# Patient Record
Sex: Female | Born: 1965 | Race: Black or African American | Hispanic: No | Marital: Married | Smoking: Current every day smoker
Health system: Southern US, Community
[De-identification: ages and names within clinical notes are randomized; demographics above are authoritative.]

## PROBLEM LIST (undated history)

## (undated) DIAGNOSIS — I10 Essential (primary) hypertension: Secondary | ICD-10-CM

## (undated) DIAGNOSIS — C73 Malignant neoplasm of thyroid gland: Secondary | ICD-10-CM

## (undated) HISTORY — PX: THYROIDECTOMY, PARTIAL: SHX18

## (undated) HISTORY — PX: TUBAL LIGATION: SHX77

---

## 2009-09-22 ENCOUNTER — Emergency Department: Payer: Self-pay | Admitting: Emergency Medicine

## 2012-10-22 ENCOUNTER — Emergency Department: Payer: Self-pay | Admitting: Emergency Medicine

## 2013-07-14 ENCOUNTER — Emergency Department: Payer: Self-pay | Admitting: Emergency Medicine

## 2014-06-03 ENCOUNTER — Observation Stay: Payer: Self-pay | Admitting: Internal Medicine

## 2014-06-03 LAB — URINALYSIS, COMPLETE
Bacteria: NONE SEEN
Bilirubin,UR: NEGATIVE
Blood: NEGATIVE
Glucose,UR: NEGATIVE mg/dL (ref 0–75)
Ketone: NEGATIVE
LEUKOCYTE ESTERASE: NEGATIVE
NITRITE: NEGATIVE
Ph: 5 (ref 4.5–8.0)
Protein: NEGATIVE
RBC,UR: <1 /HPF (ref 0–5)
SPECIFIC GRAVITY: 1.021 (ref 1.003–1.030)
Squamous Epithelial: 2
WBC UR: 1 /HPF (ref 0–5)

## 2014-06-03 LAB — CBC
HCT: 42.8 % (ref 35.0–47.0)
HGB: 14.6 g/dL (ref 12.0–16.0)
MCH: 32.5 pg (ref 26.0–34.0)
MCHC: 34 g/dL (ref 32.0–36.0)
MCV: 95 fL (ref 80–100)
PLATELETS: 236 10*3/uL (ref 150–440)
RBC: 4.48 10*6/uL (ref 3.80–5.20)
RDW: 13.9 % (ref 11.5–14.5)
WBC: 5.1 10*3/uL (ref 3.6–11.0)

## 2014-06-03 LAB — CK-MB
CK-MB: 48.1 ng/mL — AB (ref 0.5–3.6)
CK-MB: 44 ng/mL — ABNORMAL HIGH (ref 0.5–3.6)

## 2014-06-03 LAB — COMPREHENSIVE METABOLIC PANEL
ALBUMIN: 3.1 g/dL — AB (ref 3.4–5.0)
ALT: 65 U/L (ref 12–78)
ANION GAP: 6 — AB (ref 7–16)
Alkaline Phosphatase: 67 U/L
BUN: 12 mg/dL (ref 7–18)
Bilirubin,Total: 0.3 mg/dL (ref 0.2–1.0)
CO2: 27 mmol/L (ref 21–32)
CREATININE: 0.98 mg/dL (ref 0.60–1.30)
Calcium, Total: 8.3 mg/dL — ABNORMAL LOW (ref 8.5–10.1)
Chloride: 104 mmol/L (ref 98–107)
Glucose: 98 mg/dL (ref 65–99)
OSMOLALITY: 274 (ref 275–301)
Potassium: 3.9 mmol/L (ref 3.5–5.1)
SGOT(AST): 70 U/L — ABNORMAL HIGH (ref 15–37)
Sodium: 137 mmol/L (ref 136–145)
TOTAL PROTEIN: 8.5 g/dL — AB (ref 6.4–8.2)

## 2014-06-03 LAB — TROPONIN I: Troponin-I: <0.02 ng/mL

## 2014-06-03 LAB — TSH: Thyroid Stimulating Horm: 11.3 u[IU]/mL — ABNORMAL HIGH

## 2014-06-03 LAB — PHOSPHORUS: Phosphorus: 3.3 mg/dL (ref 2.5–4.9)

## 2014-06-03 LAB — CK TOTAL AND CKMB (NOT AT ARMC)
CK, Total: 2401 U/L — ABNORMAL HIGH
CK-MB: 52.3 ng/mL — ABNORMAL HIGH (ref 0.5–3.6)

## 2014-06-03 LAB — SEDIMENTATION RATE: Erythrocyte Sed Rate: 30 mm/hr — ABNORMAL HIGH (ref 0–20)

## 2014-06-03 LAB — LACTATE DEHYDROGENASE: LDH: 385 U/L — AB (ref 81–246)

## 2014-06-03 LAB — CK: CK, Total: 2048 U/L — ABNORMAL HIGH

## 2014-06-03 LAB — T4, FREE: Free Thyroxine: 1.02 ng/dL (ref 0.76–1.46)

## 2014-06-03 LAB — MAGNESIUM: MAGNESIUM: 1.8 mg/dL

## 2014-06-04 LAB — BASIC METABOLIC PANEL
Anion Gap: 6 — ABNORMAL LOW (ref 7–16)
BUN: 11 mg/dL (ref 7–18)
Calcium, Total: 8 mg/dL — ABNORMAL LOW (ref 8.5–10.1)
Chloride: 107 mmol/L (ref 98–107)
Co2: 28 mmol/L (ref 21–32)
Creatinine: 1.04 mg/dL (ref 0.60–1.30)
EGFR (African American): >60
EGFR (Non-African Amer.): >60
GLUCOSE: 105 mg/dL — AB (ref 65–99)
Osmolality: 281 (ref 275–301)
POTASSIUM: 4.2 mmol/L (ref 3.5–5.1)
Sodium: 141 mmol/L (ref 136–145)

## 2014-06-04 LAB — CK: CK, Total: 1785 U/L — ABNORMAL HIGH

## 2014-06-10 ENCOUNTER — Ambulatory Visit: Payer: Self-pay | Admitting: Surgery

## 2014-06-10 LAB — DRUG SCREEN, URINE
Amphetamines, Ur Screen: NEGATIVE (ref ?–1000)
BENZODIAZEPINE, UR SCRN: NEGATIVE (ref ?–200)
Barbiturates, Ur Screen: NEGATIVE (ref ?–200)
Cannabinoid 50 Ng, Ur ~~LOC~~: NEGATIVE (ref ?–50)
Cocaine Metabolite,Ur ~~LOC~~: NEGATIVE (ref ?–300)
MDMA (ECSTASY) UR SCREEN: NEGATIVE (ref ?–500)
Methadone, Ur Screen: NEGATIVE (ref ?–300)
OPIATE, UR SCREEN: NEGATIVE (ref ?–300)
PHENCYCLIDINE (PCP) UR S: NEGATIVE (ref ?–25)
Tricyclic, Ur Screen: NEGATIVE (ref ?–1000)

## 2014-06-22 LAB — PATHOLOGY REPORT

## 2014-07-15 ENCOUNTER — Ambulatory Visit: Payer: Self-pay | Admitting: Rheumatology

## 2014-07-22 ENCOUNTER — Ambulatory Visit: Payer: Self-pay | Admitting: Internal Medicine

## 2014-07-27 ENCOUNTER — Ambulatory Visit: Payer: Self-pay | Admitting: Internal Medicine

## 2014-07-29 ENCOUNTER — Ambulatory Visit: Payer: Self-pay | Admitting: Internal Medicine

## 2014-08-26 ENCOUNTER — Ambulatory Visit: Payer: Self-pay | Admitting: Internal Medicine

## 2014-11-04 ENCOUNTER — Ambulatory Visit: Payer: Self-pay | Admitting: Internal Medicine

## 2014-11-05 ENCOUNTER — Ambulatory Visit: Payer: Self-pay | Admitting: Internal Medicine

## 2014-11-26 ENCOUNTER — Ambulatory Visit: Payer: Self-pay | Admitting: Internal Medicine

## 2014-11-26 DIAGNOSIS — C73 Malignant neoplasm of thyroid gland: Secondary | ICD-10-CM

## 2014-11-26 HISTORY — DX: Malignant neoplasm of thyroid gland: C73

## 2015-03-19 NOTE — Consult Note (Signed)
PATIENT NAME:  Miranda Barrett, Miranda Barrett MR#:  [id number] DATE OF BIRTH:  1966/03/10  DATE OF CONSULTATION:  06/03/2014  REFERRING PHYSICIAN:   CONSULTING PHYSICIAN:  Meda Klinefelter., MD  REASON FOR CONSULTATION: Elevated CPK.   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 She has history of obesity. She has history of hypothyroid, not currently on medicines. For the last few months, she has gained weight, probably 45-50 pounds. In the last 3 months, she has had more difficulty with aching in her thighs. She has had more difficulty getting around the house. She has to take baby steps in order to climb the steps in her home. They had moved. She thought it was simply related to that. She has not held her grandchild. She sometimes has to get help in getting up off the floor. She has to use her hands to get up from a chair.  She started aching recently in her forearms and then started having some numbness or tingling in her forearms. Two weeks ago, she had a dizzy episode with vertigo that lasted about 2 days. She had some numbness in the left arm and was concerned about heart attack and came to the hospital by rescue. CPK was found to be 2400, TSH 11, sedimentation rate 30, CBC normal, met-C normal. She has history of cigarettes and cannabis. She has not had any Raynaud's or photosensitivity. There has been no skin rash. She is not on statin drugs, is not on any drugs. She has not had any falls.   PAST MEDICAL HISTORY: 1. Hypothyroid.  2. Obesity.   MEDICATIONS ON ADMISSION:  None.    REVIEW OF SYSTEMS: Loud snoring, sometimes wakes herself up. No shortness of breath or dysphagia. No photosensitivity. Otherwise as above.   FAMILY HISTORY: Negative for connective tissue disease.   PHYSICAL EXAMINATION:  GENERAL: Pleasant female in no acute distress.  VITAL SIGNS: Temperature 98, blood pressure 140/90, pulse 84.   SKIN: Without any rash in the hands or the face or the  scalp.  OROPHARYNX: Clear.  NECK: No thyromegaly.  NECK: Good carotid upstroke.  CHEST: Clear.  HEART: No murmur.  ABDOMEN: Nontender.  EXTREMITIES: No significant edema.  MUSCULOSKELETAL: Shoulders move well. Hips move well. Hands without synovitis.  NEUROLOGIC: Cranial nerves intact. No definite exophthalmos.  No lid lag. Diffusely hyporeflexic. She has 4/5 grip biceps, triceps, and hip flexors and neck flexors. She can get up from a chair without using her hands 5 times but has to rock forward.  She can stand on her heels and her toes. She has difficulty raising the leg up off the bed but can to mild resistance.   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 Three months of progressive weakness and elevated CPK. She has no stigmata of dermatomyositis and no obvious stigmata malignancy.  2. Hypothyroid.  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 She has had thyroid disease in the past.  3. Morbid obesity.  4. Presumed sleep apnea.  5. ASPIRIN AND NONSTEROIDAL ALLERGY: HIVES AND ANGIOEDEMA.   RECOMMENDATION:  1. Look for inflammation by doing an MRI of the thighs. If inflammation is noted then would recommend muscle biopsy. Could be arranged as an outpatient.  It has to be coordinated with pathology. If her MR is negative for inflammation then would to treat her hypothyroid and arrange EMGs to look for metabolic or inflammatory myopathy or neuropathy.  2. Work-ups for sleep apnea.  3. No nonsteroidals  given she has angioedema.  4. No steroids as it might alter a possible biopsy in the future.  5. I would be glad to see patient as an outpatient to follow up after MR.   6. We will get a drug screen tonight.    ____________________________ Meda Klinefelter., MD gwk:dd/am D: 06/03/2014 19:10:03 ET T: 06/03/2014 20:52:28 ET JOB#: [id number]  cc: Meda Klinefelter., MD, <Dictator> Ovidio Hanger MD ELECTRONICALLY SIGNED 06/04/2014  13:44

## 2015-03-19 NOTE — Op Note (Signed)
PATIENT NAME:  Miranda Barrett, Miranda Barrett MR#:  [id number] DATE OF BIRTH:  01/11/1966  DATE OF PROCEDURE:  06/10/2014  PREOPERATIVE DIAGNOSIS: Muscle pain and weakness, concern for polymyositis.   POSTOPERATIVE DIAGNOSIS: Muscle pain and weakness, concern for polymyositis.   PROCEDURE PERFORMED: Left quadriceps thigh muscle biopsy.   ANESTHESIA: General.   ESTIMATED BLOOD LOSS: 10 mL.  SPECIMEN:  1.  Left quadriceps thigh muscle biopsy.  2.  Muscle biopsy to be fixed in formalin for EM studies, potentially.   COMPLICATIONS: None.   INDICATION FOR SURGERY: [name] is a pleasant 49 year old female with recent onset of diffuse upper and lower limb pain and worsening weakness. She had an elevated CK and a MRI which showed muscle inflammation. I was thus consulted for a left quadriceps muscle biopsy.   DETAILS OF PROCEDURE: Informed consent was obtained. [name] was brought to the operating room suite. She was laid supine on the operating room table. She was induced. LMA was placed. General anesthesia was administered. Her left leg was then prepped and draped in standard surgical fashion. A timeout was then performed correctly identifying the patient's name, operative site and procedure to be performed. A length-wise incision was made on her anterior lateral thigh over her quadriceps. This was deepened down to the muscle. The fascia was incised. The muscle was evaluated. It was very light pink and when cut it bled only minimally. I then grabbed a significant piece of muscle that was excised using a scalpel and this was sent to pathology. I then took another large piece and placed this into a clamp to prevent retraction. This was sent to pathology again as well. The wound was examined and made hemostatic. The wound was then closed in layers. The muscle fascia was closed with a running 3-0 Vicryl and the skin was closed with a running 4-0 Monocryl subcuticular. Steri-Strips, Telfa gauze and Tegaderm then  completed the dressing. The patient was then awoken, extubated and brought to the postanesthesia care unit. There were no immediate complications. Needle, sponge, and instrument count was correct at the end of the procedure.   ____________________________ Glena Norfolk. Cyriah Childrey, MD cal:sb D: 06/11/2014 08:54:26 ET T: 06/11/2014 09:12:29 ET JOB#: [id number]  cc: Harrell Gave A. Brycelynn Stampley, MD, <Dictator> Floyde Parkins MD ELECTRONICALLY SIGNED 06/24/2014 14:51

## 2015-03-19 NOTE — Consult Note (Signed)
Brief Consult Note: Patient was seen by consultant.   Consult note dictated.   Comments: 3 mo hx of muscle weakness(trouble climbing stairs,getting up from chair) thigh pain, now with elevated cpk, mild elevation of TSH (prior rx for hypo thyroid). cannot r/o inflammatory myopathy rec 1 .Marland KitchenMRI both thighs, r/o inflammation.if positive,then arrange muscle bx as outpatient .if neg, then rx thyroid, arrange emgs as outpatient  2. no steriods, may alter bx results  3 urine drug screen ordered  4 w/up for sleep apnea 5 no NSAIDS as she gets asa/nsaid angioedema.  Electronic Signatures: Leeanne Mannan., Eugene Gavia (MD)  (Signed 09-Jul-15 19:00)  Authored: Brief Consult Note   Last Updated: 09-Jul-15 19:00 by Leeanne Mannan., Eugene Gavia (MD)

## 2015-03-19 NOTE — H&P (Signed)
PATIENT NAME:  Miranda Barrett, Miranda Barrett MR#:  [id number] DATE OF BIRTH:  17-Nov-1966  DATE OF ADMISSION:  06/03/2014  PRIMARY CARE PHYSICIAN: None.   CHIEF COMPLAINT: Paresthesias of the arms.   HISTORY OF PRESENT ILLNESS: Miranda Barrett is a 49 year old, very obese, African American female with no significant past medical history, presents to the hospital complaining of tingling, numbness, and hurting of her upper arms and hips and thigh region.  Patient states her symptoms actually started about 5 months ago when she started noticing pain in her hips and thighs in the morning that persisted throughout the day. She thought, maybe, they need to change their mattresses or they are sleeping on too hard surfaces. The pain gradually progressed now to her arms. She feels like she has been fighting with the arms for long so they ache, mostly in the proximal muscles and the posterior arm muscles, and she will notice over the last few weeks that she is having some tingling in the forearm region as well. She does not take any medications at home.  She says that she stays dehydrated.  They have moved to a new home a couple of weeks ago, lifted a lot of things and had to climb a lot of stairs with the moving process, and since then, her symptoms have actually gotten worse to the point that she was not able to do anything with her arms. This morning, her left arm tingling was much worse, so she thought she was having a heart attack, especially she being a smoker got worried and presented to the Emergency Room. All her labs here look normal, except for CK is elevated to 2400, which is almost 10 times the normal value, but her troponin is negative. She is being admitted for possible myositis at this time. Her urine does not show any myoglobinuria and her renal function is normal.   PAST MEDICAL HISTORY: Tobacco use disorder.   PAST SURGICAL HISTORY:  1.  Three C-sections.  2.  Third nipple resection from her breast.  ALLERGIES TO  MEDICATIONS: ASPIRIN CAUSES AN ANAPHYLACTIC REACTION.  CURRENT HOME MEDICATIONS: Multivitamin 1 tablet p.o. daily and Tylenol as needed for aches, that she has been using recently.   SOCIAL HISTORY: Lives at home.  Smokes about 1 pack per day.  Does not drink alcohol, uses marijuana. Last use was this past weekend.   FAMILY HISTORY: Mom has arthritis, diabetes, hypertension, and kidney disease, and dad passed away from pancreatic cancer.    REVIEW OF SYSTEMS:  CONSTITUTIONAL: No fever. Positive for fatigue and weakness.  EYES: Positive for blurred vision, and uses reading glasses. No inflammation, glaucoma or cataracts.  EAR, NOSE, AND THROAT: No tinnitus, ear pain, hearing loss, epistaxis or discharge.  RESPIRATORY: No cough, wheeze, hemoptysis or COPD.  CARDIOVASCULAR: No chest pain, orthopnea, edema, arrhythmia, palpitations, or syncope.  GASTROINTESTINAL: No nausea, vomiting, diarrhea, abdominal pain, hematemesis, or melena.  GENITOURINARY: No dysuria, hematuria, renal calculus, frequency, or incontinence.  ENDOCRINE: No polyuria, nocturia, thyroid problems, heat or cold intolerance.  HEMATOLOGY: No anemia, easy bruising or bleeding.  SKIN: No acne, rash or lesion.  MUSCULOSKELETAL: Bilateral arm pain in the upper and mid-back region between the shoulder blades.  Possible arthritis.  No gout.  NEUROLOGICAL: No numbness, weakness, CVA, TIA or seizures.  PSYCHOLOGIC: No anxiety, insomnia or depression.  PHYSICAL EXAMINATION: VITAL SIGNS:  Temperature 98.8 degrees Fahrenheit, pulse 96, respirations 18, blood pressure 131/92, pulse oximetry 92% on room air.  GENERAL: Heavily built, well-nourished  female lying in bed, not in any acute distress.  HEENT: Normocephalic, atraumatic. Pupils equal, round, reacting to light. Anicteric sclerae. Extraocular movements intact.  OROPHARYNX: Clear without erythema, mass or exudates.  NECK: Supple. No thyromegaly, JVD or carotid bruits. No  lymphadenopathy.  LUNGS: Moving air bilaterally. No wheeze or crackles. No use of accessory muscles for breathing.  CARDIOVASCULAR: S1, S2, regular rate and rhythm. No murmurs, rubs, or gallops.  ABDOMEN: Obese, soft, nontender, nondistended. No hepatosplenomegaly. Normal bowel sounds.  EXTREMITIES: No pedal edema. No clubbing or cyanosis.  Two plus dorsalis pedis pulses palpable bilaterally.  The upper arms are tender to touch.  SKIN: No acne, rash or lesions.  LYMPHATICS: No cervical lymphadenopathy.  NEUROLOGICAL: Cranial nerves are intact. No focal motor or sensory deficits.  PSYCHOLOGICAL: The patient is awake, alert, oriented x 3.   LABORATORY DATA: WBC 5.1, hemoglobin 14.6, hematocrit 42.8, platelet count 336,000.   Sodium 137, potassium 3.9, chloride 104, bicarbonate 27, BUN 12, creatinine 0.98, glucose 98, and calcium of 8.3.   ALT 65, AST 70, alkaline phosphatase 67, total bilirubin 0.3 and albumin of 3.1. CK is elevated at 2401, CK-MB elevated at 52.3 and troponin is less than 0.02.   Chest x-ray is showing clear lung fields. No acute cardiopulmonary disease.   Urinalysis negative for any infection, blood, or protein.  EKG is showing normal sinus rhythm, heart rate of 81 no acute ST-T wave abnormalities.   ASSESSMENT AND PLAN: A 49 year old obese female with no significant past medical history, admitted for paresthesias and muscle aches, and noted to have CK of 2400, muscle aches with elevated CK and normal renal function, and urinalysis negative for any myoglobinuria.   1. Could be myositis, not on any statin that could cause it.  Not sure if inflammatory or idiopathic in nature at this time.  Ordered LDH, aldolase, ANA, ESR, and CRP.  We might benefit from giving steroids for now.  Continue IV fluids.  Recheck CPK level, also recycle troponins as CK-MB also elevated.  Rheumatology consulted  2. Tobacco use disorder. Counseled against smoking for 3 minutes, motivated to quit.   For now, we will start on a Nicotrol inhaler.   CODE STATUS: FULL CODE.   TIME SPENT ON ADMISSION: 50 minutes.  ____________________________ Gladstone Lighter, MD rk:ts D: 06/03/2014 21:19:41 ET T: 06/03/2014 13:43:10 ET JOB#: [id number]  cc: Gladstone Lighter, MD, <Dictator> Gladstone Lighter MD ELECTRONICALLY SIGNED 06/04/2014 12:04

## 2015-03-19 NOTE — Consult Note (Signed)
PATIENT NAME:  Miranda Barrett, Miranda Barrett MR#:  [id number] DATE OF BIRTH:  30-Apr-1966  DATE OF CONSULTATION:  06/05/2014  CONSULTING PHYSICIAN:  Harrell Gave A. Yahye Siebert, MD  REASON FOR CONSULTATION: Tingling, numbness and pain of arms and thighs.  HISTORY OF PRESENT ILLNESS: Miranda Barrett is a 49 year old obese female, with history of hypothyroidism and previous C-sections who presents with approximately 5 months of tingling, numbness and hurting of upper arms, hips thighs.  It began in her thighs and progressed to her arm. She has difficulties with weakness as well and reports she is unable to do very little with her arms. She was admitted to the Emergency Room for worsening left arm tingling and thought she was having an MI. She was found to have a CK of 2400 and was admitted for possible myositis. She was seen by Dr. Jefm Bryant who recommended MRI of the thigh which showed inflammation concerning for myositis.  I am thus consulted for a muscle biopsy. Otherwise, no fevers, chills, night sweats, shortness of breath, cough, chest pain, abdominal pain, nausea, vomiting, diarrhea, constipation, dysuria or hematuria.   PAST MEDICAL HISTORY: 1.  Hypothyroidism.  2.  History of 3 C-sections.  3.  History of 3rd nipple resection from breast.  4.  Morbid obesity.   HOME MEDICATIONS:   1.  Multivitamin.  2.  Tylenol p.r.n.   ALLERGIES: ASPIRIN CAUSES ANAPHYLACTIC TYPE REACTION.   SOCIAL HISTORY: Smokes 1 pack a day, uses marijuana.  No alcohol. Lives at home in [place].  Phone number [phone].   FAMILY HISTORY: Mother with arthritis, diabetes, hypertension, kidney disease.  Dad passed away from pancreatic cancer.  REVIEW OF SYSTEMS:  A 12-point review of systems was obtained and pertinent positives and negatives as above.   PHYSICAL EXAMINATION: VITAL SIGNS: Temperature 97.8, pulse 82, blood pressure 129/84, respirations 20, 97% on room air.  GENERAL: No acute distress. Alert and oriented x3.  HEAD:  Normocephalic, atraumatic.  EYES: No scleral icterus. No conjunctivitis.  FACE:  No obvious face trauma.  No external nose.  Normal external ears.   CHEST: Lungs clear to auscultation. Moving air well.  HEART: Regular rate and rhythm. No murmurs, rubs, or gallops.  ABDOMEN: Soft, nontender, nondistended.  EXTREMITIES: Moves all extremities is sitting up in bed.  NEUROLOGIC: Sensation appears to be intact. Cranial nerves II through XII grossly intact.   LABORATORY DATA: CK of 2400 currently 1785. Troponin less than 0.02, TSH 11.3. White cell count is 5.1. ESR is 30. Urinalysis is negative. LDH is 385. BMP is otherwise normal. CMP is otherwise normal.   IMAGING: MRI of bilateral thighs shows patchy areas of abnormal edema in the muscles of both thighs, asymmetric, consistent with myositis.   ASSESSMENT AND PLAN: Miranda Barrett is a pleasant 49 year old female with worsening arm and leg pain and weakness. She had findings consistent for polymyositis on her MRI of femur. I have thus been consulted for a muscle biopsy. Due to the fact that the leg muscles were involved, I would likely perform this.  I talked to Dr. Reuel Derby.  She says these specimens must be sent fresh and therefore cannot be done over the weekend. I have discussed with her the possibility of staying over the weekend for biopsy with Dr. Marina Gravel next week versus going home and having it done as an outpatient and she would prefer outpatient, but we will see how she feels tomorrow.  If outpatient surgery is needed to be scheduled, will  contact the number [phone].  ____________________________ Glena Norfolk Fate Galanti, MD cal:ds D: 06/05/2014 12:47:37 ET T: 06/05/2014 15:10:32 ET JOB#: [id number]  cc: Harrell Gave A. Billy Turvey, MD, <Dictator> Floyde Parkins MD ELECTRONICALLY SIGNED 06/06/2014 18:16

## 2015-03-19 NOTE — Discharge Summary (Signed)
PATIENT NAME:  Miranda Barrett, HOLLAR MR#:  [id number] DATE OF BIRTH:  05-07-1966  DATE OF ADMISSION:  06/03/2014 DATE OF DISCHARGE:  06/05/2014  ADMITTING DIAGNOSIS: Myositis.   DISCHARGE DIAGNOSES:  1. Polymyositis of unclear etiology. Biopsy to be scheduled as outpatient.  2. Tobacco abuse.  3. Euthyroid syndrome.   DISCHARGE CONDITION: Stable.   DISCHARGE MEDICATIONS: The patient is to resume multivitamins once daily, Tylenol 325 mg 2 tablets every 4 hours as needed, tramadol 50 mg every 6 hours as needed, nicotine oral inhaler 10 mg inhalation every 1 to 2 hours as needed.   HOME HEALTH: None.  HOME OXYGEN: None.   DIET: Regular, regular consistency.   ACTIVITY LIMITATIONS: As tolerated.   FOLLOWUP APPOINTMENT: With surgery, Dr. Rexene Edison in 2 days after discharge, rheumatology, Dr. Jefm Bryant in 1 week after discharge.   CONSULTATIONS: Dr. Rexene Edison as well as Dr. Jefm Bryant.  RADIOLOGIC STUDIES: Chest x-ray PA and lateral, 06/03/2014 showed a negative chest x-ray. MRI of bilateral lower extremities without contrast, 06/04/2014 revealed polymyositis of indeterminate etiology. See above discussion. Could be due to autoimmune granulomatous or vasculitic or systemic illnesses. Muscle infection, drug-induced rhabdomyolysis, diabetes, mild necrosis, and autoimmune polyneuropathy can appear this way according to the radiologist.   HISTORY OF PRESENT ILLNESS: The patient is a 49 year old African American female with past medical history significant for history of obesity, tobacco abuse, who presents to the hospital with complaints of paresthesias in the arms. Please refer to Dr. Wyatt Portela admission note on 06/03/2014.   PHYSICAL EXAMINATION:  VITALS SIGNS: On admission to the hospital, the patient's temperature was 98.8, pulse was 96, respiration rate was 18, blood pressure 131/92, pulse oximetry was 92% on room air.  GENERAL: Physical exam was unremarkable except of mild discomfort on palpation  of her muscles.  LABORATORY DATA: The patient's lab examination done on arrival to the hospital revealed normal BMP. Patient's CK total was elevated at 2400. MB fraction was 52.3. Troponin was less than 0.02. TSH was elevated at 11.3; however, the patient's free thyroxine level was 1.02 which was within normal limits. The patient's erythrocyte sedimentation rate was found to be elevated at 30. White blood cell count was normal at 5.1, hemoglobin was 14.6, platelet count 236,000. Urinalysis was unremarkable.   The patient was admitted to the hospital for further evaluation. She was evaluated and consulted by rheumatologist, Dr. Jefm Bryant, who saw patient in consultation the same day, 06/03/2014. Dr. Jefm Bryant felt the patient has inflammatory myopathy due to 3 months of progressive weakness as well as elevated CPK. According to Dr. Jefm Bryant, the patient had no stigmata of dermatomyositis and no obvious stigmata of malignancy In regards to hypothyroidism, according to Dr. Jefm Bryant, it can represent thyroid myopathy; however, the patient's TSH was not elevated. I recommended to get MRI of the patient's thighs. If inflammation is noted then they recommended to proceed to a muscle biopsy, which can be arranged as outpatient. If MRI is negative for inflammation, then Dr. Jefm Bryant recommended to treat her for hypothyroidism and arrange EMG to look for metabolic or inflammatory myopathy or neuropathy. He also recommended to work up for sleep apnea as well as not to use any aspirin or other nonsteroidal anti-inflammatory medications as the patient has history of angioedema with these medications as well as not to use steroids, as it may alter a possible biopsy in the future. He recommended to follow up with him as outpatient after MRI. The patient did have radiologic studies done. MRI of muscles of the  thigh, which revealed inflammation. She also had aldolase level checked which is pending at the time of the dictation. CPR  was checked, was found to be high at 3.99. The patient's ANA comprehensive panel was checked which anti-DNA antibodies were negative. The patient's RNP antibodies were less than 0.1, which were negative. Smith antibodies 0.2, which were negative. Antiscleroderma 70 antibodies were 0.2 as well. However, Sjgren's anti SSA as well as Sjgren's anti SSD were high, more than 8. The patient did have a urine drug screen checked which was negative for any substances.   The patient was initiated on pain medications and her discomfort, pain, improved somewhat. She was also given IV fluids to rehydrate her. She had urinalysis checked which showed no myoglobin consistent with myopathy but not rhabdomyolysis. The patient was evaluated by Dr. Rexene Edison, who recommended outpatient muscle biopsy for which the patient was agreeable. The patient is to follow up with Dr. Rexene Edison in the next few days after discharge to get muscle biopsy. She is to follow up with Dr. Jefm Bryant for further recommendations and treatment for myopathy.  VITALS SIGNS: On the day of discharge, the patient's vital signs: Temperature was 99.6, pulse was 76, respiration rate was 20, blood pressure 143/88, saturation 98% on room air at rest.   TIME SPENT: 49 minutes on this patient.  ,  ____________________________ Theodoro Grist, MD rv:lt D: 06/05/2014 17:58:51 ET T: 06/05/2014 22:39:31 ET JOB#: [id number]  cc: Theodoro Grist, MD, <Dictator> Christopher A. Lundquist, MD Meda Klinefelter., MD   Theodoro Grist MD ELECTRONICALLY SIGNED 06/20/2014 16:21

## 2015-04-14 ENCOUNTER — Telehealth: Payer: Self-pay | Admitting: Internal Medicine

## 2015-04-14 NOTE — Telephone Encounter (Addendum)
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 Now she has received a new dx of thyroid cancer after bx @ [place] last week. They want her to go to [facility] for surgery but she had a very good experience with [place] and wants to stick with [facility] and our [place] for any necessary treatments. Can you please call her to discuss surgeons and how to get her referral changed to [facility]? Thanks. Please call: [phone].

## 2015-04-15 NOTE — Telephone Encounter (Signed)
Spoke to pt and she thought because she got the dx from bx that it ws thyroid cancer that she needs to see onc. First.  I told her that with thyroid cancer the first step after bx is sch. Surgery and the thyroid will be removed.  After that depending on the pathology she would either f/u with oncology or endocrinologist.  In knowing that she will just stick with [facility] because she has a relationship there.

## 2015-04-26 ENCOUNTER — Ambulatory Visit: Payer: Medicaid Other | Attending: Family Medicine | Admitting: Physical Therapy

## 2015-05-11 ENCOUNTER — Ambulatory Visit: Payer: Medicaid Other | Admitting: Physical Therapy

## 2015-06-02 ENCOUNTER — Ambulatory Visit: Payer: Medicaid Other

## 2015-11-08 ENCOUNTER — Other Ambulatory Visit: Payer: Self-pay | Admitting: Rheumatology

## 2015-11-08 DIAGNOSIS — M3320 Polymyositis, organ involvement unspecified: Secondary | ICD-10-CM

## 2015-11-22 ENCOUNTER — Ambulatory Visit: Payer: Medicaid Other

## 2016-01-02 ENCOUNTER — Ambulatory Visit
Admission: RE | Admit: 2016-01-02 | Discharge: 2016-01-02 | Disposition: A | Discharge status: Home / Self Care | Payer: Medicaid Other | Source: Ambulatory Visit | Attending: Rheumatology | Admitting: Rheumatology

## 2016-01-02 DIAGNOSIS — R1902 Left upper quadrant abdominal swelling, mass and lump: Secondary | ICD-10-CM | POA: Insufficient documentation

## 2016-01-02 DIAGNOSIS — E279 Disorder of adrenal gland, unspecified: Secondary | ICD-10-CM | POA: Insufficient documentation

## 2016-01-02 DIAGNOSIS — M3320 Polymyositis, organ involvement unspecified: Secondary | ICD-10-CM

## 2016-01-02 HISTORY — DX: Malignant neoplasm of thyroid gland: C73

## 2016-01-02 HISTORY — DX: Essential (primary) hypertension: I10

## 2016-01-02 MED ORDER — IOHEXOL 300 MG/ML  SOLN
100.0000 mL | Freq: Once | INTRAMUSCULAR | Status: AC | PRN
  Administered 2016-01-02: 100 mL via INTRAVENOUS

## 2016-03-12 IMAGING — CT CT ABD-PELV W/ CM
1 of 3 series · 14 of 32 positions shown, 19 images · IV contrast (APPLIED)
Comparison: 11/04/2014

CLINICAL DATA: Followup left upper quadrant mass. Loss of appetite
for 6 months.

EXAM:
CT ABDOMEN AND PELVIS WITH CONTRAST
TECHNIQUE: Multidetector CT imaging of the abdomen and pelvis was performed
using the standard protocol following bolus administration of
intravenous contrast.

[Series 2: axial st · axial · 0.71mm/px · z∈[-900,-515]mm · 14 of 87 slices shown, 19 images]
[im 5/87  soft-tissue]
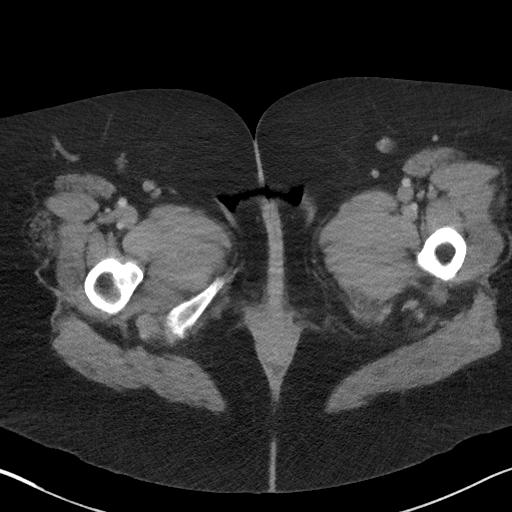
[im 5/87  bone]
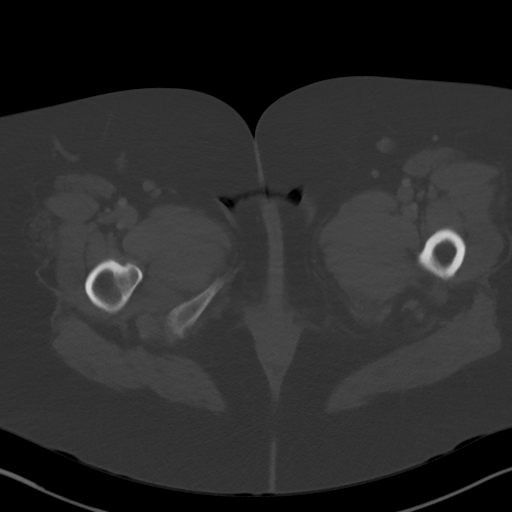
[im 14/87  soft-tissue]
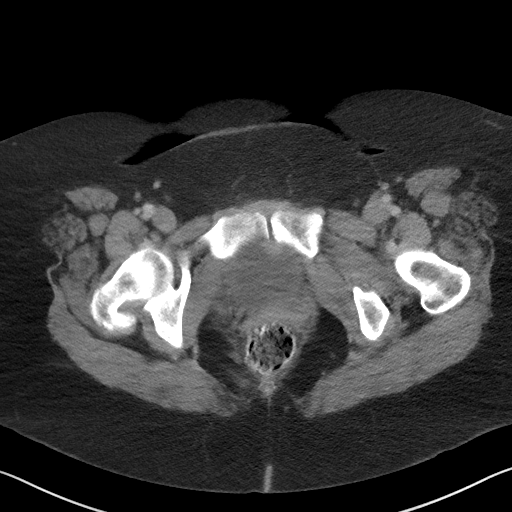
[im 19/87  soft-tissue]
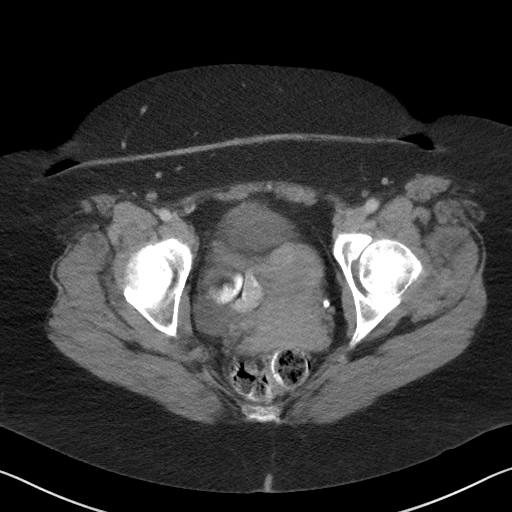
[im 23/87  soft-tissue]
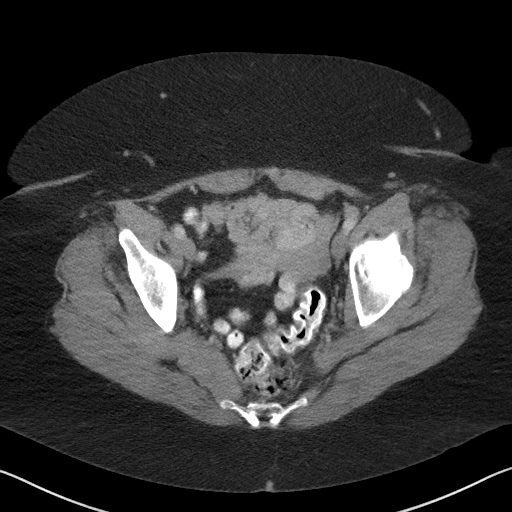
[im 32/87  soft-tissue]
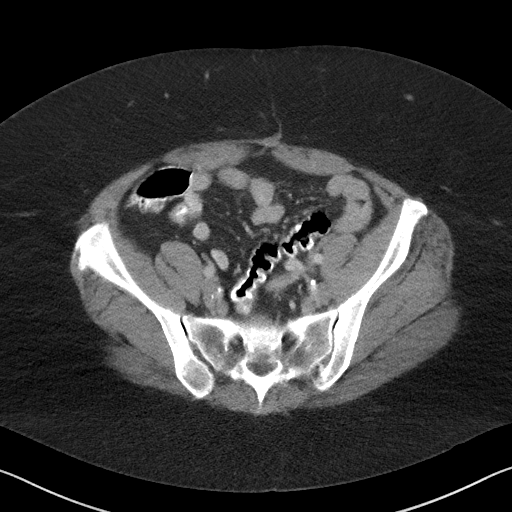
[im 37/87  soft-tissue]
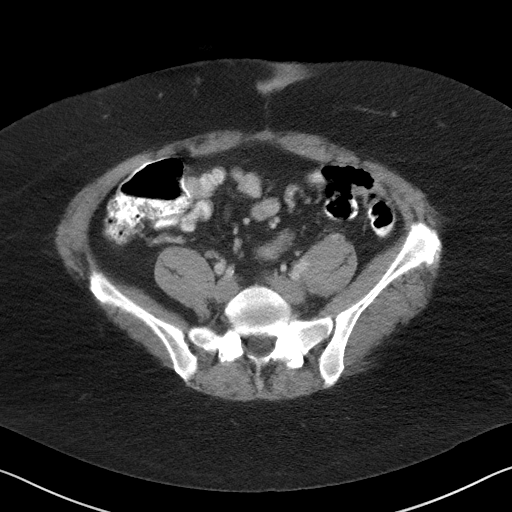
[im 46/87  soft-tissue]
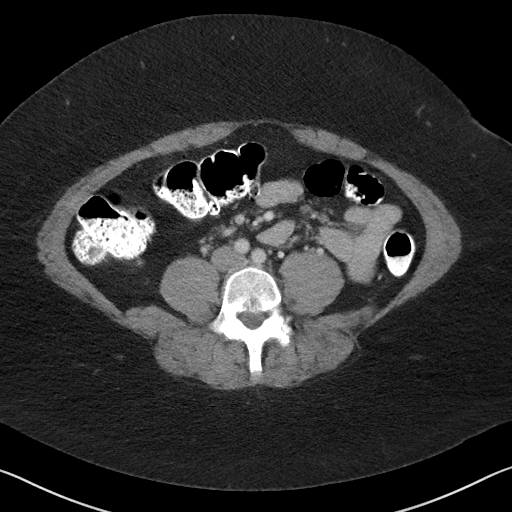
[im 50/87  soft-tissue]
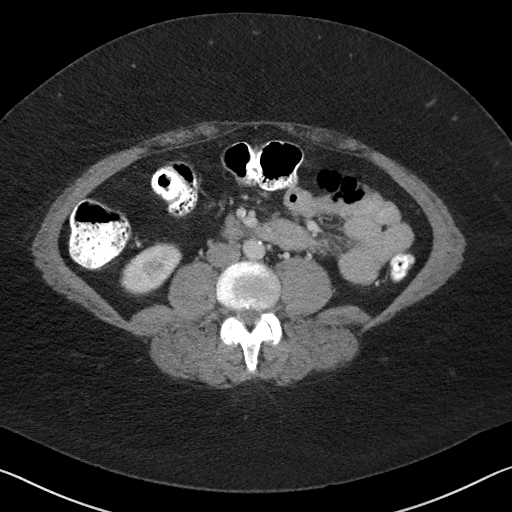
[im 55/87  soft-tissue]
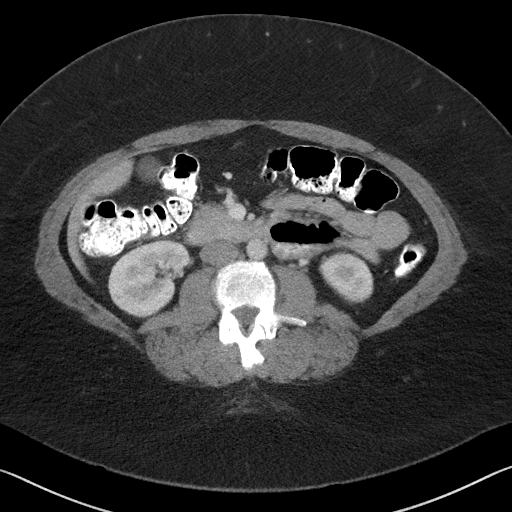
[im 55/87  bone]
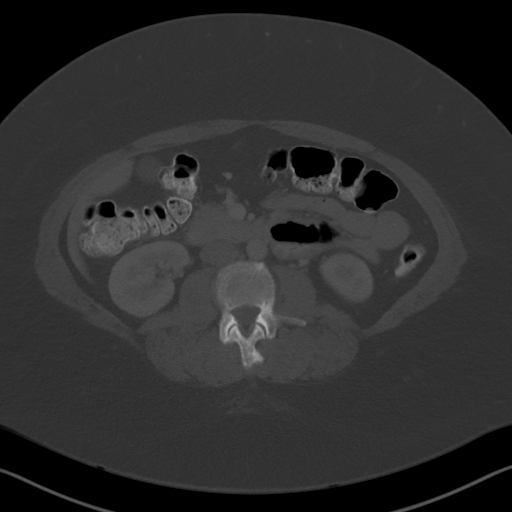
[im 64/87  soft-tissue]
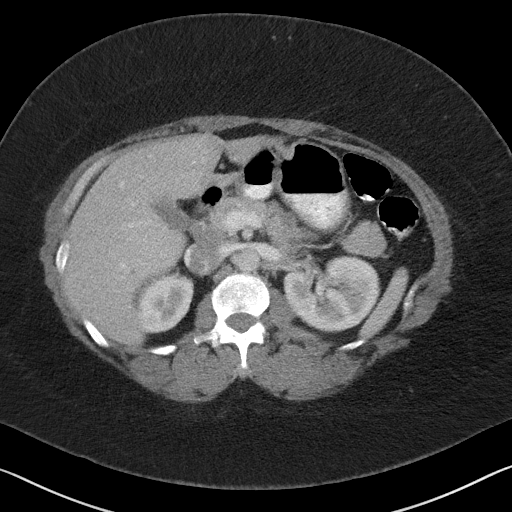
[im 68/87  soft-tissue]
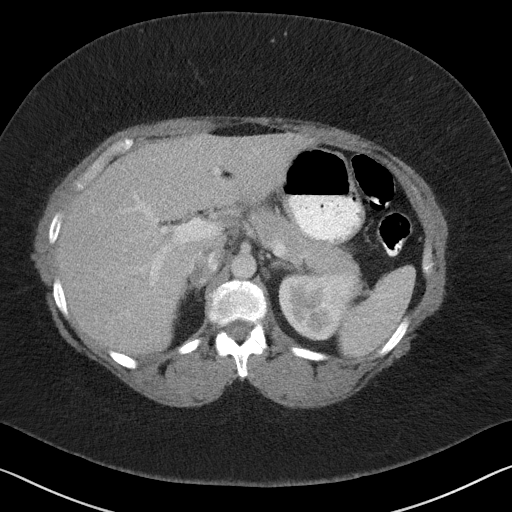
[im 68/87  lung]
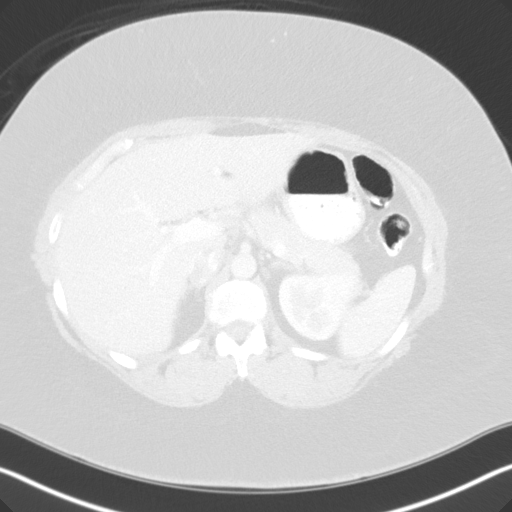
[im 73/87  soft-tissue]
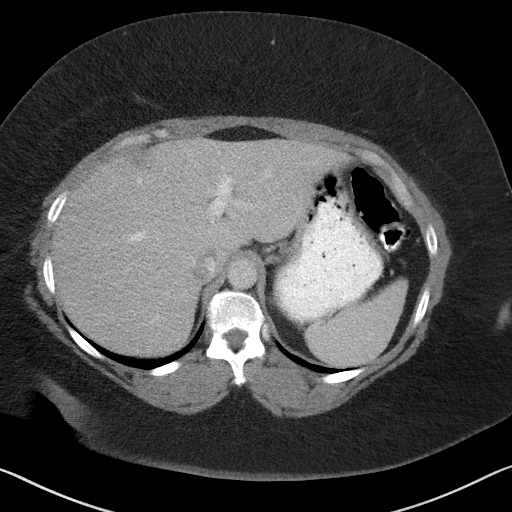
[im 73/87  lung]
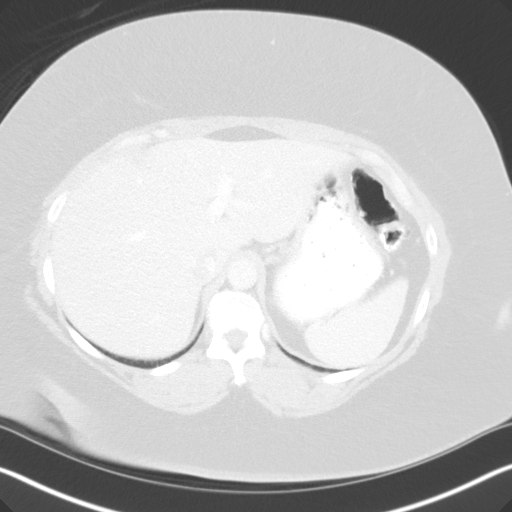
[im 77/87  lung]
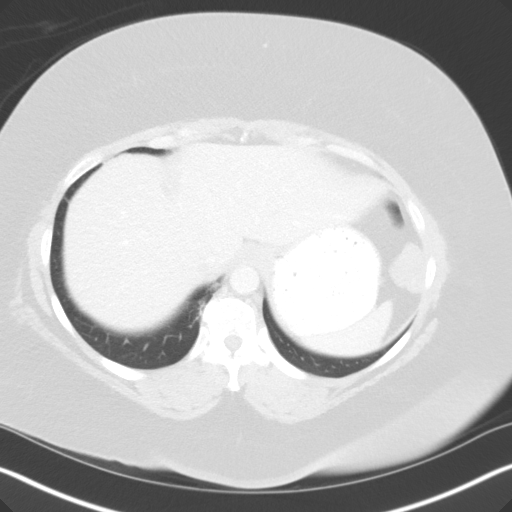
[im 82/87  soft-tissue]
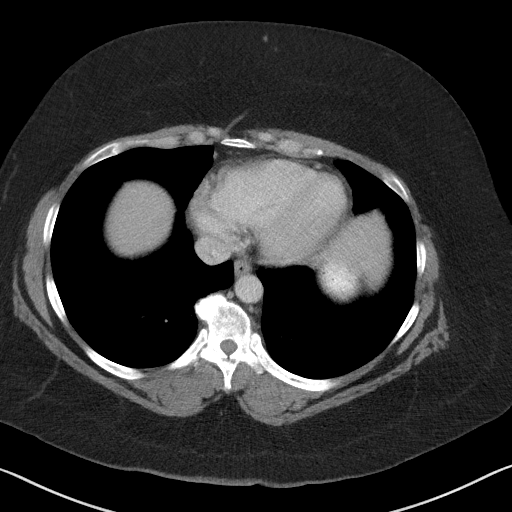
[im 82/87  lung]
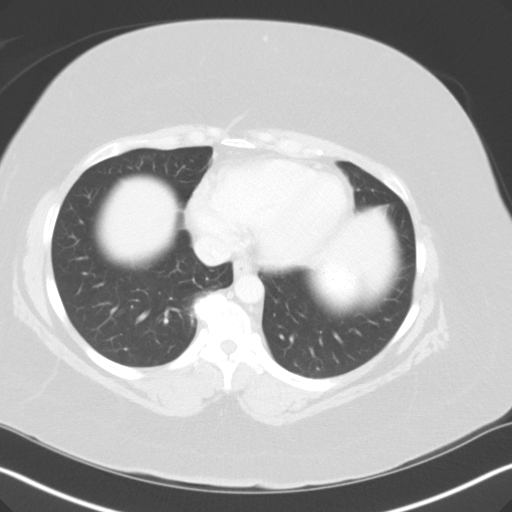

[14 of 32 positions shown; findings below may reference images not displayed]

CONTRAST:  100mL OMNIPAQUE IOHEXOL 300 MG/ML  SOLN

The patient developed severe itching immediately after the study
compatible with an allergic reaction. The patient was assessed by
Dr. Afanu and given 25 mg of Benadryl PO. The itching greatly
subsided over the next 20-30 minutes. No additional problems.
FINDINGS: Lung bases are clear.  No effusions.  Heart is normal size.

Tiny hypodensity peripherally in the right hepatic lobe is stable
since prior study and most likely a small cyst. Liver otherwise
unremarkable. Spleen, gallbladder, pancreas, right adrenal and
kidneys are unremarkable. Small low-density nodule in the left
adrenal gland measures 12 mm and is stable.

Low-density mass in the left upper quadrant adjacent to the left
hemidiaphragm is again noted, measuring up to 3.2 cm, stable.

Bowel grossly unremarkable. No free fluid, free air, or adenopathy.
Uterus, adnexae and urinary bladder are unremarkable. Aorta and
iliac vessels are normal caliber. No acute bony abnormality or focal
bone lesion.
IMPRESSION: Stable 3.2 cm low-density mass in the left upper quadrant adjacent
left hemidiaphragm. Stability since 1303 suggests a benign process.

Stable left adrenal nodule, likely adenoma.

## 2017-10-29 ENCOUNTER — Encounter: Payer: Self-pay | Admitting: Emergency Medicine

## 2017-10-29 ENCOUNTER — Other Ambulatory Visit: Payer: Self-pay

## 2017-10-29 ENCOUNTER — Emergency Department: Payer: Medicare Other

## 2017-10-29 ENCOUNTER — Emergency Department
Admission: EM | Admit: 2017-10-29 | Discharge: 2017-10-29 | Disposition: A | Discharge status: Home / Self Care | Payer: Medicare Other | Attending: Emergency Medicine | Admitting: Emergency Medicine

## 2017-10-29 DIAGNOSIS — F17210 Nicotine dependence, cigarettes, uncomplicated: Secondary | ICD-10-CM | POA: Insufficient documentation

## 2017-10-29 DIAGNOSIS — R55 Syncope and collapse: Secondary | ICD-10-CM | POA: Insufficient documentation

## 2017-10-29 DIAGNOSIS — Z79899 Other long term (current) drug therapy: Secondary | ICD-10-CM | POA: Insufficient documentation

## 2017-10-29 DIAGNOSIS — I10 Essential (primary) hypertension: Secondary | ICD-10-CM | POA: Diagnosis not present

## 2017-10-29 LAB — URINALYSIS, COMPLETE (UACMP) WITH MICROSCOPIC
Bilirubin Urine: NEGATIVE
Glucose, UA: NEGATIVE mg/dL
Hgb urine dipstick: NEGATIVE
Ketones, ur: NEGATIVE mg/dL
Nitrite: NEGATIVE
PH: 7 (ref 5.0–8.0)
Protein, ur: NEGATIVE mg/dL
SPECIFIC GRAVITY, URINE: 1.011 (ref 1.005–1.030)

## 2017-10-29 LAB — CBC
HEMATOCRIT: 45.3 % (ref 35.0–47.0)
Hemoglobin: 15.5 g/dL (ref 12.0–16.0)
MCH: 31.8 pg (ref 26.0–34.0)
MCHC: 34.2 g/dL (ref 32.0–36.0)
MCV: 93 fL (ref 80.0–100.0)
PLATELETS: 265 10*3/uL (ref 150–440)
RBC: 4.87 MIL/uL (ref 3.80–5.20)
RDW: 13.9 % (ref 11.5–14.5)
WBC: 4.6 10*3/uL (ref 3.6–11.0)

## 2017-10-29 LAB — BASIC METABOLIC PANEL
Anion gap: 8 (ref 5–15)
BUN: 18 mg/dL (ref 6–20)
CHLORIDE: 104 mmol/L (ref 101–111)
CO2: 25 mmol/L (ref 22–32)
CREATININE: 0.9 mg/dL (ref 0.44–1.00)
Calcium: 8.9 mg/dL (ref 8.9–10.3)
GFR calc Af Amer: >60 mL/min (ref >60)
GFR calc non Af Amer: >60 mL/min (ref >60)
GLUCOSE: 108 mg/dL — AB (ref 65–99)
Potassium: 3.9 mmol/L (ref 3.5–5.1)
Sodium: 137 mmol/L (ref 135–145)

## 2017-10-29 LAB — POCT PREGNANCY, URINE: PREG TEST UR: NEGATIVE

## 2017-10-29 NOTE — ED Triage Notes (Signed)
Pt to ED c/o near syncopal episodes x2 weeks, denies LOC, denies pain or SOB.

## 2017-10-29 NOTE — ED Provider Notes (Signed)
[facility] Emergency Department Provider Note ____________________________________________   I have reviewed the triage vital signs and the triage nursing note.  HISTORY  Chief Complaint Near Syncope   Historian Patient  HPI Miranda Barrett is a 51 y.o. female presents to the ED after she went to try to get her outpatient rheumatology appointment today, where she is followed for polymyositis, and states that they had trouble getting her blood pressure and told her to go to the ER for further evaluation.  Patient states that she feels "bad "all the time in terms of fatigue.  Today driving and she felt a little bit lightheaded.  No focal weakness or numbness.  Patient states a few days ago she felt a little lightheaded like she might pass out but did not fully pass out.  No chest pain or palpitations.   Past Medical History:  Diagnosis Date  . Hypertension   . Thyroid cancer ([place]) 2016   Left thyroidectomy     There are no active problems to display for this patient.   Past Surgical History:  Procedure Laterality Date  . CESAREAN SECTION    . THYROIDECTOMY, PARTIAL    . TUBAL LIGATION      Prior to Admission medications   Medication Sig Start Date End Date Taking? Authorizing Provider  albuterol (PROAIR HFA) 108 (90 Base) MCG/ACT inhaler Inhale 2 puffs into the lungs every 4 (four) hours as needed. 01/03/17  Yes [provider]  folic acid (FOLVITE) 1 MG tablet Take 1 tablet by mouth daily. 01/17/15  Yes [provider]  hydrOXYzine (ATARAX/VISTARIL) 25 MG tablet Take 1 tablet by mouth 3 (three) times daily. 08/20/14  Yes [provider]  levothyroxine (SYNTHROID, LEVOTHROID) 137 MCG tablet Take 1 tablet by mouth daily. 07/22/17  Yes [provider]  predniSONE (DELTASONE) 20 MG tablet Take 1 tablet by mouth daily. 07/25/17  Yes [provider]  traMADol (ULTRAM) 50 MG tablet Take 50 mg by mouth every 6 (six)  hours as needed. 04/11/15  Yes [provider]  triamterene-hydrochlorothiazide (DYAZIDE) 37.5-25 MG capsule Take 1 capsule by mouth daily. 10/02/16  Yes [provider]  Vitamin D, Ergocalciferol, (DRISDOL) 50000 units CAPS capsule Take 1 capsule by mouth every 7 (seven) days. 08/22/17  Yes [provider]    Allergies  Allergen Reactions  . Aspirin Hives  . Hydrocodone Nausea And Vomiting  . Omnipaque [Iohexol] Itching  . Tramadol Nausea Only    History reviewed. No pertinent family history.  Social History Social History   Tobacco Use  . Smoking status: Current Every Day Smoker    Types: Cigarettes  . Smokeless tobacco: Never Used  Substance Use Topics  . Alcohol use: No    Frequency: Never  . Drug use: No    Review of Systems  Constitutional: Negative for fever. Eyes: Negative for visual changes. ENT: Negative for sore throat. Cardiovascular: Negative for chest pain. Respiratory: Negative for shortness of breath. Gastrointestinal: Negative for abdominal pain, vomiting and diarrhea. Genitourinary: Negative for dysuria. Musculoskeletal: Negative for back pain. Skin: Negative for rash. Neurological: Negative for headache.  ____________________________________________   PHYSICAL EXAM:  VITAL SIGNS: ED Triage Vitals  Enc Vitals Group     BP 10/29/17 1104 135/79     Pulse Rate 10/29/17 1104 88     Resp 10/29/17 1104 20     Temp 10/29/17 1104 98.4 F (36.9 C)     Temp Source 10/29/17 1104 Oral  SpO2 10/29/17 1104 97 %     Weight 10/29/17 1105 230 lb (104.3 kg)     Height 10/29/17 1105 5\' 1"  (1.549 m)     Head Circumference --      Peak Flow --      Pain Score --      Pain Loc --      Pain Edu? --      Excl. in [place]? --      Constitutional: Alert and oriented. Well appearing and in no distress. HEENT   Head: Normocephalic and atraumatic.      Eyes: Conjunctivae are normal. Pupils equal and round.       Ears:         Nose:  No congestion/rhinnorhea.   Mouth/Throat: Mucous membranes are moist.   Neck: No stridor. Cardiovascular/Chest: Normal rate, regular rhythm.  No murmurs, rubs, or gallops. Respiratory: Normal respiratory effort without tachypnea nor retractions. Breath sounds are clear and equal bilaterally. No wheezes/rales/rhonchi. Gastrointestinal: Soft. No distention, no guarding, no rebound. Nontender.  Base Genitourinary/rectal:Deferred Musculoskeletal: Nontender with normal range of motion in all extremities. No joint effusions.  No lower extremity tenderness.  No edema. Neurologic:  Normal speech and language. No gross or focal neurologic deficits are appreciated. Skin:  Skin is warm, dry and intact. No rash noted. Psychiatric: Mood and affect are normal. Speech and behavior are normal. Patient exhibits appropriate insight and judgment.   ____________________________________________  LABS (pertinent positives/negatives) I, Lisa Roca, MD the attending physician have reviewed the labs noted below.  Labs Reviewed  BASIC METABOLIC PANEL - Abnormal; Notable for the following components:      Result Value   Glucose, Bld 108 (*)    All other components within normal limits  URINALYSIS, COMPLETE (UACMP) WITH MICROSCOPIC - Abnormal; Notable for the following components:   Color, Urine STRAW (*)    APPearance CLEAR (*)    Leukocytes, UA TRACE (*)    Bacteria, UA RARE (*)    Squamous Epithelial / LPF 0-5 (*)    All other components within normal limits  CBC  POC URINE PREG, ED  POCT PREGNANCY, URINE    ____________________________________________    EKG I, Lisa Roca, MD, the attending physician have personally viewed and interpreted all ECGs.  80 bpm normal sinus rhythm.  Narrow QS.  Normal axis.  Normal ST and T wave.  No evidence of Brugada or Wolff-Parkinson-White. ____________________________________________  RADIOLOGY All Xrays were viewed by me.  Imaging interpreted by  Radiologist, and I, Lisa Roca, MD the attending physician have reviewed the radiologist interpretation noted below.  CXR: IMPRESSION: No active cardiopulmonary disease. __________________________________________  PROCEDURES  Procedure(s) performed: None  Critical Care performed: None   ____________________________________________  ED COURSE / ASSESSMENT AND PLAN  Pertinent labs & imaging results that were available during my care of the patient were reviewed by me and considered in my medical decision making (see chart for details).   Patient reports feeling fatigued and body aches without coughing or dyspnea or palpitations or chest pain.  No symptoms of respiratory complaints, no indication right now for me for chest x-ray.  EKG was performed and that was reassuring and normal in appearance.  Laboratory studies are overall reassuring without acute anemia, electro light disturbance, and patient is not having urine infection symptoms, I did discuss with her that there was rare bacteria and will send for culture.  Patient states that she did not for the blood pressure cuff puffing up when they  told her that they could get her blood pressure and is thinking that perhaps the blood pressure cuff was malfunctioning.  In any case no true syncope, patient feels overall well here.  Will check orthostatics.  Attempted to get in touch with rheumatology office, patient is unable to be fit in this afternoon, she will need to call for appointment for next week.  Patient does have underlying wheezing, which is mild right now, she declined albuterol treatment in the ER because she is trying to "get out of [place] before traffic. "  Patient did tell the nurse that she has been having a cough for 2 weeks, so chest x-ray was added on and this was reassuring and   DIFFERENTIAL DIAGNOSIS: Including but not limited to arrhythmia, orthostatic hypotension, anemia,  CONSULTATIONS:    None   Patient / Family / Caregiver informed of clinical course, medical decision-making process, and agree with plan.   I discussed return precautions, follow-up instructions, and discharge instructions with patient and/or family.  Discharge Instructions : You are evaluated for possible low blood pressure at the office, no low blood pressures here, but with standing her heart did raise some.  Make sure you are taking plenty fluids.  Given lightheadedness and nearly passing out over the past, I am recommending that you follow-up with primary care doctor as well as a cardiologist as well as make an appointment with your rheumatologist.  Return to the emergency room immediately for any worsening condition including chest pain, breathing problems, weakness, numbness, confusion or altered mental status, passing out, or any other symptoms concerning to you.    ___________________________________________   FINAL CLINICAL IMPRESSION(S) / ED DIAGNOSES   Final diagnoses:  Near syncope      ___________________________________________        Note: This dictation was prepared with Dragon dictation. Any transcriptional errors that result from this process are unintentional    Lisa Roca, MD 10/29/17 1517

## 2017-10-29 NOTE — Discharge Instructions (Signed)
You are evaluated for possible low blood pressure at the office, no low blood pressures here, but with standing her heart did race some.  Make sure you are taking plenty fluids.  Given lightheadedness and nearly passing out over the past, I am recommending that you follow-up with primary care doctor as well as a cardiologist as well as make an appointment with your rheumatologist.  Return to the emergency room immediately for any worsening condition including chest pain, breathing problems, weakness, numbness, confusion or altered mental status, passing out, or any other symptoms concerning to you.
